# Patient Record
Sex: Female | Born: 1960 | Race: Black or African American | Hispanic: No | Marital: Married | Smoking: Never smoker
Health system: Southern US, Community
[De-identification: ages and names within clinical notes are randomized; demographics above are authoritative.]

## PROBLEM LIST (undated history)

## (undated) DIAGNOSIS — I10 Essential (primary) hypertension: Secondary | ICD-10-CM

## (undated) HISTORY — DX: Essential (primary) hypertension: I10

---

## 2006-03-10 HISTORY — PX: CHOLECYSTECTOMY: SHX55

## 2009-07-08 DEATH — deceased

## 2016-06-13 ENCOUNTER — Ambulatory Visit: Payer: Self-pay | Admitting: Family Medicine

## 2016-06-19 NOTE — Progress Notes (Signed)
Pre visit review using our clinic review tool, if applicable. No additional management support is needed unless otherwise documented below in the visit note. 

## 2016-06-19 NOTE — Patient Instructions (Addendum)
  A few things to remember from today's visit:   URI, acute - Plan: benzonatate (TESSALON) 100 MG capsule  Hypertension, essential, benign - Plan: Basic metabolic panel, amLODipine-benazepril (LOTREL) 5-10 MG capsule  Morbid obesity with body mass index (BMI) of 45.0 to 49.9 in adult (HCC)  Blood pressure goal for most people is less than 140/90. Some populations (older than 60) the goal is less than 150/90.  Most recent cardiologists' recommendations recommend blood pressure at or less than 130/80.   Elevated blood pressure increases the risk of strokes, heart and kidney disease, and eye problems. Regular physical activity and a healthy diet (DASH diet) usually help. Low salt diet. Take medications as instructed.  Caution with some over the counter medications as cold medications, dietary products (for weight loss), and Ibuprofen or Aleve (frequent use);all these medications could cause elevation of blood pressure.  Cold:  viral infections are self-limited and we treat each symptom depending of severity.  Over the counter medications as decongestants and cold medications usually help, they need to be taken with caution if there is a history of high blood pressure or palpitations.SO NO RECOMMENDED FOR YOU>  Tylenol also helps with most symptoms (headache, muscle aching, fever,etc) Plenty of fluids. Honey helps with cough. Steam inhalations helps with runny nose, nasal congestion, and may prevent sinus infections. Cough and nasal congestion could last a few days and sometimes weeks. Please follow in not any better in 1-2 weeks or if symptoms get worse.   Please be sure medication list is accurate. If a new problem present, please set up appointment sooner than planned today.          WE NOW OFFER   [facility] FAST TRACK!!!  SAME DAY Appointments for ACUTE CARE  Such as: Sprains, Injuries, cuts, abrasions, rashes, muscle pain, joint pain, back pain Colds,  flu, sore throats, headache, allergies, cough, fever  Ear pain, sinus and eye infections Abdominal pain, nausea, vomiting, diarrhea, upset stomach Animal/insect bites  3 Easy Ways to Schedule: Walk-In Scheduling Call in scheduling Mychart Sign-up: [email or web address]

## 2016-06-20 ENCOUNTER — Encounter: Payer: Self-pay | Admitting: Family Medicine

## 2016-06-20 ENCOUNTER — Ambulatory Visit (INDEPENDENT_AMBULATORY_CARE_PROVIDER_SITE_OTHER): Payer: BLUE CROSS/BLUE SHIELD | Admitting: Family Medicine

## 2016-06-20 VITALS — BP 165/90 | HR 86 | Resp 12 | Ht 62.0 in | Wt 252.1 lb

## 2016-06-20 DIAGNOSIS — I10 Essential (primary) hypertension: Secondary | ICD-10-CM | POA: Insufficient documentation

## 2016-06-20 DIAGNOSIS — J069 Acute upper respiratory infection, unspecified: Secondary | ICD-10-CM

## 2016-06-20 DIAGNOSIS — Z6841 Body Mass Index (BMI) 40.0 and over, adult: Secondary | ICD-10-CM | POA: Diagnosis not present

## 2016-06-20 LAB — BASIC METABOLIC PANEL
BUN: 9 mg/dL (ref 7–25)
CO2: 23 mmol/L (ref 20–31)
Calcium: 9.2 mg/dL (ref 8.6–10.4)
Chloride: 102 mmol/L (ref 98–110)
Creat: 1 mg/dL (ref 0.50–1.05)
GLUCOSE: 70 mg/dL (ref 65–99)
POTASSIUM: 4.5 mmol/L (ref 3.5–5.3)
SODIUM: 141 mmol/L (ref 135–146)

## 2016-06-20 LAB — POC INFLUENZA A&B (BINAX/QUICKVUE)
Influenza A, POC: NEGATIVE
Influenza B, POC: NEGATIVE

## 2016-06-20 MED ORDER — BENZONATATE 100 MG PO CAPS: 200.0000 mg | ORAL_CAPSULE | Freq: Two times a day (BID) | ORAL | 0 refills | Status: AC | PRN

## 2016-06-20 MED ORDER — AMLODIPINE BESY-BENAZEPRIL HCL 5-10 MG PO CAPS
1.0000 | ORAL_CAPSULE | Freq: Every day | ORAL | 2 refills | Status: DC
Start: 2016-06-20 — End: 2017-03-16

## 2016-06-20 NOTE — Progress Notes (Signed)
HPI:   [name] is a 56 y.o. female, who is here today to establish care with me.  Former PCP: Dr Courtney Scott, [place]. She moved from [place] in August 2017. Last preventive routine visit: 2017 She has not had colonoscopy and she is not interested in doing so for now.  Chronic medical problems: HTN. She denies history of diabetes,CKD, or hyperlipidemia.   Concerns today:   Hypertension:   She has not been on antihypertensive med for about 3 months, she is not sure about medication she was taking, she thinks it was a combination of lisinopril-HCTZ. According to patient, her BP was not well-controlled even when she was taking medication. She has not been consistent with low salt diet.  She does not check BP at home.  She has not noted visual changes, exertional chest pain, dyspnea,  focal weakness, or edema. Frontal pressure headache, "regular normal headache."  -She does not exercise regularly, she has not been consistent with a healthy diet.  Respiratory symptoms:  Today she is complaining of 2 days of fever, 101 F.  Also sore throat, nasal congestion, rhinorrhea, nonproductive cough, body aches, and fatigue.   Denies chest pain, dyspnea, or wheezing.  No Hx of recent travel. No sick contact. No known insect bite. No Hx of allergies.  OTC medications for this problem: Took Tylenol cold and sinus,none today. Symptoms otherwise stable.    Review of Systems  Constitutional: Positive for appetite change, fatigue and fever. Negative for chills.  HENT: Positive for congestion, postnasal drip, rhinorrhea and sore throat. Negative for ear pain, mouth sores, sinus pressure, sneezing, trouble swallowing and voice change.   Eyes: Positive for discharge. Negative for redness and visual disturbance.  Respiratory: Positive for cough. Negative for shortness of breath and wheezing.   Cardiovascular: Negative for chest pain, palpitations and leg swelling.    Gastrointestinal: Negative for abdominal pain, nausea and vomiting.       No changes in bowel habits.  Endocrine: Negative for cold intolerance and heat intolerance.  Genitourinary: Negative for decreased urine volume and hematuria.  Musculoskeletal: Positive for myalgias. Negative for back pain and neck pain.  Skin: Negative for pallor and rash.  Neurological: Positive for headaches. Negative for syncope and weakness.  Hematological: Negative for adenopathy. Does not bruise/bleed easily.  Psychiatric/Behavioral: Negative for confusion. The patient is not nervous/anxious.      No current outpatient prescriptions on file prior to visit.   No current facility-administered medications on file prior to visit.     Past Medical History:  Diagnosis Date  . Hypertension    Not on File  Family History  Problem Relation Age of Onset  . Cancer Mother     breast  . Hypertension Mother   . Diabetes Mother   . Hypertension Father     Social History   Social History  . Marital status: Unknown    Spouse name: N/A  . Number of children: N/A  . Years of education: N/A   Social History Main Topics  . Smoking status: Never Smoker  . Smokeless tobacco: Never Used  . Alcohol use No  . Drug use: No  . Sexual activity: Not Asked   Other Topics Concern  . None   Social History Narrative  . None    Vitals:   06/20/16 1459 06/20/16 1538  BP: (!) 180/90 (!) 165/90  Pulse: 86   Resp: 12    O2 sat at RA 96% Body  mass index is 46.11 kg/m.  Physical Exam  Nursing note and vitals reviewed. Constitutional: She is oriented to person, place, and time. She appears well-developed. No distress.  HENT:  Head: Atraumatic.  Nose: Rhinorrhea present. Right sinus exhibits no maxillary sinus tenderness and no frontal sinus tenderness. Left sinus exhibits no maxillary sinus tenderness and no frontal sinus tenderness.  Mouth/Throat: Uvula is midline and mucous membranes are normal. Posterior  oropharyngeal erythema (mild) present. No oropharyngeal exudate or posterior oropharyngeal edema.  Eyes: Conjunctivae and EOM are normal. Pupils are equal, round, and reactive to light.  Neck: No tracheal deviation present. Thyromegaly (Mild) present. No thyroid mass present.  Cardiovascular: Normal rate and regular rhythm.   No murmur heard. Pulses:      Dorsalis pedis pulses are 2+ on the right side, and 2+ on the left side.  Respiratory: Effort normal and breath sounds normal. No respiratory distress.  GI: Soft. She exhibits no mass. There is no hepatomegaly. There is no tenderness.  Musculoskeletal: She exhibits no edema.  Lymphadenopathy:    She has cervical adenopathy.       Right cervical: Posterior cervical adenopathy present.       Left cervical: Posterior cervical adenopathy present.  Neurological: She is alert and oriented to person, place, and time. She has normal strength. Coordination and gait normal.  Skin: Skin is warm. No rash noted. No erythema.  Psychiatric: She has a normal mood and affect.  Well groomed, good eye contact.      ASSESSMENT AND PLAN:   Courtney Scott was seen today for establish care.  Diagnoses and all orders for this visit:  URI, acute  Symptoms suggests a viral etiology, symptomatic treatment recommended. Instructed to monitor for signs of complications, instructed about warning signs. I also explained that cough and nasal congestion can last a few days and sometimes weeks.  F/U as needed.   -     benzonatate (TESSALON) 100 MG capsule; Take 2 capsules (200 mg total) by mouth 2 (two) times daily as needed. -     POC Influenza A&B(BINAX/QUICKVUE)  Hypertension, essential, benign  Not well controlled. She should avoid cold medications. Possible complications of elevated BP discussed. Instructed to monitor BP at home. Annual eye examination, reported as current. F.U in 6 weeks.  -     amLODipine-benazepril (LOTREL) 5-10 MG capsule; Take 1  capsule by mouth daily. -     Basic metabolic panel  Morbid obesity with body mass index (BMI) of 45.0 to 49.9 in adult [facility])  We discussed benefits of wt loss as well as adverse effects of obesity. Consistency with healthy diet and physical activity recommended. Daily brisk walking for 15-30 min as tolerated.    Betty G. [place], MD  [facility]. [facility].

## 2017-02-16 ENCOUNTER — Ambulatory Visit: Payer: BLUE CROSS/BLUE SHIELD | Admitting: Family Medicine

## 2017-03-15 NOTE — Progress Notes (Signed)
[name] is a 57 y.o.female, who is here today to follow on HTN.  Currently she is on Lotrel 5-10 mg daily.  She is taking medications as instructed, no side effects reported. BP readings at home: "good" 130's/70-80's.  Last eye exam: 2015.  She has not noted unusual headache, visual changes, exertional chest pain, dyspnea,  focal weakness, or edema.    Lab Results  Component Value Date   CREATININE 1.00 06/20/2016   BUN 9 06/20/2016   NA 141 06/20/2016   K 4.5 06/20/2016   CL 102 06/20/2016   CO2 23 06/20/2016    Obesity:  Dietary changes since last OV: Not consistently Exercise: None  She is concerned about possibility of developing diabetes, she has positive family history. Denies abdominal pain, nausea,vomiting, polydipsia,polyuria, or polyphagia.     Review of Systems  Constitutional: Negative for activity change, appetite change, fatigue, fever and unexpected weight change.  HENT: Negative for mouth sores, nosebleeds and trouble swallowing.   Eyes: Negative for redness and visual disturbance.  Respiratory: Negative for cough, shortness of breath and wheezing.   Cardiovascular: Negative for chest pain, palpitations and leg swelling.  Gastrointestinal: Negative for abdominal pain, nausea and vomiting.       Negative for changes in bowel habits.  Endocrine: Negative for polydipsia, polyphagia and polyuria.  Genitourinary: Negative for decreased urine volume, dysuria and hematuria.  Musculoskeletal: Negative for gait problem and myalgias.  Neurological: Negative for syncope, weakness, numbness and headaches.  Psychiatric/Behavioral: Negative for confusion. The patient is not nervous/anxious.      No current outpatient medications on file prior to visit.   No current facility-administered medications on file prior to visit.      Past Medical History:  Diagnosis Date  . Hypertension     Not on File  Social History   Socioeconomic  History  . Marital status: Married    Spouse name: None  . Number of children: None  . Years of education: None  . Highest education level: None  Social Needs  . Financial resource strain: None  . Food insecurity - worry: None  . Food insecurity - inability: None  . Transportation needs - medical: None  . Transportation needs - non-medical: None  Occupational History  . None  Tobacco Use  . Smoking status: Never Smoker  . Smokeless tobacco: Never Used  Substance and Sexual Activity  . Alcohol use: No  . Drug use: No  . Sexual activity: None  Other Topics Concern  . None  Social History Narrative  . None    Vitals:   03/16/17 0828  BP: 132/76  Pulse: 60  Temp: 98 F (36.7 C)  SpO2: 99%   Body mass index is 47.42 kg/m.  Wt Readings from Last 3 Encounters:  03/16/17 259 lb 4 oz (117.6 kg)  06/20/16 252 lb 2 oz (114.4 kg)    Physical Exam  Nursing note and vitals reviewed. Constitutional: She is oriented to person, place, and time. She appears well-developed. No distress.  HENT:  Head: Normocephalic and atraumatic.  Mouth/Throat: Oropharynx is clear and moist and mucous membranes are normal.  Eyes: Conjunctivae are normal. Pupils are equal, round, and reactive to light.  Neck: No tracheal deviation present. No thyroid mass present.  Cardiovascular: Normal rate and regular rhythm.  No murmur heard. Pulses:      Dorsalis pedis pulses are 2+ on the right side, and 2+ on the left side.  Respiratory: Effort normal  and breath sounds normal. No respiratory distress.  GI: Soft. She exhibits no mass. There is no hepatomegaly. There is no tenderness.  Musculoskeletal: She exhibits no edema.  Lymphadenopathy:    She has no cervical adenopathy.  Neurological: She is alert and oriented to person, place, and time. She has normal strength. Coordination normal.  Skin: Skin is warm. No erythema.  Psychiatric: She has a normal mood and affect.  Well groomed, good eye contact.     ASSESSMENT AND PLAN:   Courtney Scott was seen today for follow-up.  Diagnoses and all orders for this visit:  Lab Results  Component Value Date   ALT 17 03/16/2017   AST 17 03/16/2017   ALKPHOS 96 03/16/2017   BILITOT 0.4 03/16/2017   Lab Results  Component Value Date   CREATININE 0.71 03/16/2017   BUN 9 03/16/2017   NA 140 03/16/2017   K 3.8 03/16/2017   CL 103 03/16/2017   CO2 28 03/16/2017    Hypertension, essential, benign  Adequately controlled. No changes in current management. DASH diet recommended. Eye exam recommended annually. F/U in 6 months, before if needed.  -     Comprehensive metabolic panel -     amLODipine-benazepril (LOTREL) 5-10 MG capsule; Take 1 capsule by mouth daily.  Morbid obesity with body mass index (BMI) of 45.0 to 49.9 in adult [facility](HCC)  She has gained about 7 pounds since 06/2016.   We discussed benefits of wt loss as well as adverse effects of obesity. Consistency with healthy diet and physical activity recommended. Daily brisk walking for 15-30 min as tolerated.  Diabetes mellitus screening  Primary prevention through a healthy lifestyle recommended and discussed. Further recommendations will be given according to lab results.  -     Hemoglobin A1c -     Comprehensive metabolic panel      -Courtney Scott was advised to return sooner than planned today if new concerns arise.     Hridaan Bouse G. [place], MD  [facility]. [facility].

## 2017-03-16 ENCOUNTER — Encounter: Payer: Self-pay | Admitting: Family Medicine

## 2017-03-16 ENCOUNTER — Ambulatory Visit: Payer: BLUE CROSS/BLUE SHIELD | Admitting: Family Medicine

## 2017-03-16 VITALS — BP 132/76 | HR 60 | Temp 98.0°F | Ht 62.0 in | Wt 259.2 lb

## 2017-03-16 DIAGNOSIS — Z6841 Body Mass Index (BMI) 40.0 and over, adult: Secondary | ICD-10-CM

## 2017-03-16 DIAGNOSIS — I10 Essential (primary) hypertension: Secondary | ICD-10-CM

## 2017-03-16 DIAGNOSIS — Z131 Encounter for screening for diabetes mellitus: Secondary | ICD-10-CM | POA: Diagnosis not present

## 2017-03-16 LAB — COMPREHENSIVE METABOLIC PANEL
ALK PHOS: 96 U/L (ref 39–117)
ALT: 17 U/L (ref 0–35)
AST: 17 U/L (ref 0–37)
Albumin: 4.1 g/dL (ref 3.5–5.2)
BILIRUBIN TOTAL: 0.4 mg/dL (ref 0.2–1.2)
BUN: 9 mg/dL (ref 6–23)
CO2: 28 meq/L (ref 19–32)
Calcium: 9.1 mg/dL (ref 8.4–10.5)
Chloride: 103 mEq/L (ref 96–112)
Creatinine, Ser: 0.71 mg/dL (ref 0.40–1.20)
GFR: 90.38 mL/min (ref >60.00)
Glucose, Bld: 103 mg/dL — ABNORMAL HIGH (ref 70–99)
Potassium: 3.8 mEq/L (ref 3.5–5.1)
SODIUM: 140 meq/L (ref 135–145)
TOTAL PROTEIN: 7.1 g/dL (ref 6.0–8.3)

## 2017-03-16 LAB — HEMOGLOBIN A1C: HEMOGLOBIN A1C: 5.8 % (ref 4.6–6.5)

## 2017-03-16 MED ORDER — AMLODIPINE BESY-BENAZEPRIL HCL 5-10 MG PO CAPS: 1.0000 | ORAL_CAPSULE | Freq: Every day | ORAL | 1 refills | Status: DC

## 2017-03-16 NOTE — Patient Instructions (Signed)
A few things to remember from today's visit:   Hypertension, essential, benign - Plan: Comprehensive metabolic panel  Morbid obesity with body mass index (BMI) of 45.0 to 49.9 in adult [facility](HCC)  Diabetes mellitus screening - Plan: Hemoglobin A1c, Comprehensive metabolic panel   DASH Eating Plan DASH stands for "Dietary Approaches to Stop Hypertension." The DASH eating plan is a healthy eating plan that has been shown to reduce high blood pressure (hypertension). It may also reduce your risk for type 2 diabetes, heart disease, and stroke. The DASH eating plan may also help with weight loss. What are tips for following this plan? General guidelines  Avoid eating more than 2,300 mg (milligrams) of salt (sodium) a day. If you have hypertension, you may need to reduce your sodium intake to 1,500 mg a day.  Limit alcohol intake to no more than 1 drink a day for nonpregnant women and 2 drinks a day for men. One drink equals 12 oz of beer, 5 oz of wine, or 1 oz of hard liquor.  Work with your health care provider to maintain a healthy body weight or to lose weight. Ask what an ideal weight is for you.  Get at least 30 minutes of exercise that causes your heart to beat faster (aerobic exercise) most days of the week. Activities may include walking, swimming, or biking.  Work with your health care provider or diet and nutrition specialist (dietitian) to adjust your eating plan to your individual calorie needs. Reading food labels  Check food labels for the amount of sodium per serving. Choose foods with less than 5 percent of the Daily Value of sodium. Generally, foods with less than 300 mg of sodium per serving fit into this eating plan.  To find whole grains, look for the word "whole" as the first word in the ingredient list. Shopping  Buy products labeled as "low-sodium" or "no salt added."  Buy fresh foods. Avoid canned foods and premade or frozen meals. Cooking  Avoid adding salt when  cooking. Use salt-free seasonings or herbs instead of table salt or sea salt. Check with your health care provider or pharmacist before using salt substitutes.  Do not fry foods. Cook foods using healthy methods such as baking, boiling, grilling, and broiling instead.  Cook with heart-healthy oils, such as olive, canola, soybean, or sunflower oil. Meal planning   Eat a balanced diet that includes: ? 5 or more servings of fruits and vegetables each day. At each meal, try to fill half of your plate with fruits and vegetables. ? Up to 6-8 servings of whole grains each day. ? Less than 6 oz of lean meat, poultry, or fish each day. A 3-oz serving of meat is about the same size as a deck of cards. One egg equals 1 oz. ? 2 servings of low-fat dairy each day. ? A serving of nuts, seeds, or beans 5 times each week. ? Heart-healthy fats. Healthy fats called Omega-3 fatty acids are found in foods such as flaxseeds and coldwater fish, like sardines, salmon, and mackerel.  Limit how much you eat of the following: ? Canned or prepackaged foods. ? Food that is high in trans fat, such as fried foods. ? Food that is high in saturated fat, such as fatty meat. ? Sweets, desserts, sugary drinks, and other foods with added sugar. ? Full-fat dairy products.  Do not salt foods before eating.  Try to eat at least 2 vegetarian meals each week.  Eat more home-cooked food  and less restaurant, buffet, and fast food.  When eating at a restaurant, ask that your food be prepared with less salt or no salt, if possible. What foods are recommended? The items listed may not be a complete list. Talk with your dietitian about what dietary choices are best for you. Grains Whole-grain or whole-wheat bread. Whole-grain or whole-wheat pasta. Brown rice. [place]. Bulgur. Whole-grain and low-sodium cereals. Pita bread. Low-fat, low-sodium crackers. Whole-wheat flour tortillas. Vegetables Fresh or frozen vegetables  (raw, steamed, roasted, or grilled). Low-sodium or reduced-sodium tomato and vegetable juice. Low-sodium or reduced-sodium tomato sauce and tomato paste. Low-sodium or reduced-sodium canned vegetables. Fruits All fresh, dried, or frozen fruit. Canned fruit in natural juice (without added sugar). Meat and other protein foods Skinless chicken or [place]. Ground chicken or [place]. Pork with fat trimmed off. Fish and seafood. Egg whites. Dried beans, peas, or lentils. Unsalted nuts, nut butters, and seeds. Unsalted canned beans. Lean cuts of beef with fat trimmed off. Low-sodium, lean deli meat. Dairy Low-fat (1%) or fat-free (skim) milk. Fat-free, low-fat, or reduced-fat cheeses. Nonfat, low-sodium ricotta or cottage cheese. Low-fat or nonfat yogurt. Low-fat, low-sodium cheese. Fats and oils Soft margarine without trans fats. Vegetable oil. Low-fat, reduced-fat, or light mayonnaise and salad dressings (reduced-sodium). Canola, safflower, olive, soybean, and sunflower oils. Avocado. Seasoning and other foods Herbs. Spices. Seasoning mixes without salt. Unsalted popcorn and pretzels. Fat-free sweets. What foods are not recommended? The items listed may not be a complete list. Talk with your dietitian about what dietary choices are best for you. Grains Baked goods made with fat, such as croissants, muffins, or some breads. Dry pasta or rice meal packs. Vegetables Creamed or fried vegetables. Vegetables in a cheese sauce. Regular canned vegetables (not low-sodium or reduced-sodium). Regular canned tomato sauce and paste (not low-sodium or reduced-sodium). Regular tomato and vegetable juice (not low-sodium or reduced-sodium). Angie Fava. Olives. Fruits Canned fruit in a light or heavy syrup. Fried fruit. Fruit in cream or butter sauce. Meat and other protein foods Fatty cuts of meat. Ribs. Fried meat. Berniece Salines. Sausage. Bologna and other processed lunch meats. Salami. Fatback. Hotdogs. Bratwurst. Salted nuts  and seeds. Canned beans with added salt. Canned or smoked fish. Whole eggs or egg yolks. Chicken or [place] with skin. Dairy Whole or 2% milk, cream, and half-and-half. Whole or full-fat cream cheese. Whole-fat or sweetened yogurt. Full-fat cheese. Nondairy creamers. Whipped toppings. Processed cheese and cheese spreads. Fats and oils Butter. Stick margarine. Lard. Shortening. Ghee. Bacon fat. Tropical oils, such as coconut, palm kernel, or palm oil. Seasoning and other foods Salted popcorn and pretzels. Onion salt, garlic salt, seasoned salt, table salt, and sea salt. Worcestershire sauce. Tartar sauce. Barbecue sauce. Teriyaki sauce. Soy sauce, including reduced-sodium. Steak sauce. Canned and packaged gravies. Fish sauce. Oyster sauce. Cocktail sauce. Horseradish that you find on the shelf. Ketchup. Mustard. Meat flavorings and tenderizers. Bouillon cubes. Hot sauce and Tabasco sauce. Premade or packaged marinades. Premade or packaged taco seasonings. Relishes. Regular salad dressings. Where to find more information:  National Heart, Lung, and [place]: https://wilson-eaton.com/  American Heart Association: www.heart.org Summary  The DASH eating plan is a healthy eating plan that has been shown to reduce high blood pressure (hypertension). It may also reduce your risk for type 2 diabetes, heart disease, and stroke.  With the DASH eating plan, you should limit salt (sodium) intake to 2,300 mg a day. If you have hypertension, you may need to reduce your sodium intake to 1,500 mg a  day.  When on the DASH eating plan, aim to eat more fresh fruits and vegetables, whole grains, lean proteins, low-fat dairy, and heart-healthy fats.  Work with your health care provider or diet and nutrition specialist (dietitian) to adjust your eating plan to your individual calorie needs. This information is not intended to replace advice given to you by your health care provider. Make sure you discuss any questions  you have with your health care provider. Document Released: 02/13/2011 Document Revised: 02/18/2016 Document Reviewed: 02/18/2016 Elsevier Interactive Patient Education  [employer].   Please be sure medication list is accurate. If a new problem present, please set up appointment sooner than planned today.

## 2017-06-29 ENCOUNTER — Ambulatory Visit: Payer: Self-pay | Admitting: Family Medicine

## 2017-06-29 DIAGNOSIS — Z2089 Contact with and (suspected) exposure to other communicable diseases: Secondary | ICD-10-CM

## 2017-11-29 NOTE — Progress Notes (Signed)
HPI:   [name] is a 57 y.o. female, who is here today for her routine physical.  Last CPE: 2017  Regular exercise 3 or more time per week: She just started water aerobic last week,she is planning on doing it 2/week. Following a healthy diet: She is "trying." She lives with her husband.  Chronic medical problems: HTN, obesity  HTN on Amlodipine-Benazepril 5-10 mg daily. BP elevated today. Denies severe/frequent headache, visual changes, chest pain, dyspnea, palpitation, claudication, focal weakness, or edema. She is not checking BP's at home.   Pap smear 2017 negative. Hx of abnormal pap smears: Denies. Hx of STD's Denies. M: 57 yo.  G:4 A:2 L:2 LMP 2008 after endometrial ablation.  She took OCP's for  A while before ablation.   She is not sexually active.  There is no immunization history on file for this patient.  Mammogram: 2017 Colonoscopy: Has not had colomn cancer screening done. DEXA: N/A  Hep C screening: Never done.  She has no concerns today.   Review of Systems  Constitutional: Negative for appetite change, fatigue, fever and unexpected weight change.  HENT: Negative for dental problem, hearing loss, nosebleeds, trouble swallowing and voice change.   Eyes: Negative for redness and visual disturbance.  Respiratory: Negative for cough, shortness of breath and wheezing.   Cardiovascular: Negative for chest pain and leg swelling.  Gastrointestinal: Negative for abdominal pain, blood in stool, nausea and vomiting.       No changes in bowel habits.  Endocrine: Negative for cold intolerance, heat intolerance, polydipsia, polyphagia and polyuria.  Genitourinary: Negative for decreased urine volume, dysuria, hematuria, vaginal bleeding and vaginal discharge.       No breast tenderness or nipple discharge.  Musculoskeletal: Negative for gait problem and myalgias.  Skin: Negative for rash.  Neurological: Negative for syncope,  weakness and headaches.  Hematological: Negative for adenopathy. Does not bruise/bleed easily.  Psychiatric/Behavioral: Negative for confusion and sleep disturbance. The patient is not nervous/anxious.   All other systems reviewed and are negative.     No current outpatient medications on file prior to visit.   No current facility-administered medications on file prior to visit.      Past Medical History:  Diagnosis Date  . Hypertension     Past Surgical History:  Procedure Laterality Date  . CHOLECYSTECTOMY  2008    Not on File  Family History  Problem Relation Age of Onset  . Cancer Mother        breast  . Hypertension Mother   . Diabetes Mother   . Hypertension Father     Social History   Socioeconomic History  . Marital status: Married    Spouse name: Not on file  . Number of children: Not on file  . Years of education: Not on file  . Highest education level: Not on file  Occupational History  . Not on file  Social Needs  . Financial resource strain: Not on file  . Food insecurity:    Worry: Not on file    Inability: Not on file  . Transportation needs:    Medical: Not on file    Non-medical: Not on file  Tobacco Use  . Smoking status: Never Smoker  . Smokeless tobacco: Never Used  Substance and Sexual Activity  . Alcohol use: No  . Drug use: No  . Sexual activity: Not on file  Lifestyle  . Physical activity:    Days per week: Not  on file    Minutes per session: Not on file  . Stress: Not on file  Relationships  . Social connections:    Talks on phone: Not on file    Gets together: Not on file    Attends religious service: Not on file    Active member of club or organization: Not on file    Attends meetings of clubs or organizations: Not on file    Relationship status: Not on file  Other Topics Concern  . Not on file  Social History Narrative  . Not on file     Vitals:   11/30/17 0817  BP: (!) 150/90  Pulse: 62  Resp: 16  Temp:  98 F (36.7 C)  SpO2: 96%   Body mass index is 48.1 kg/m.   Wt Readings from Last 3 Encounters:  11/30/17 263 lb (119.3 kg)  03/16/17 259 lb 4 oz (117.6 kg)  06/20/16 252 lb 2 oz (114.4 kg)    Physical Exam  Nursing note and vitals reviewed. Constitutional: She is oriented to person, place, and time. She appears well-developed. No distress.  HENT:  Head: Normocephalic and atraumatic.  Right Ear: Hearing, tympanic membrane, external ear and ear canal normal.  Left Ear: Hearing, tympanic membrane, external ear and ear canal normal.  Mouth/Throat: Uvula is midline, oropharynx is clear and moist and mucous membranes are normal.  Eyes: Pupils are equal, round, and reactive to light. Conjunctivae and EOM are normal.  Neck: No tracheal deviation present. No thyromegaly present.  Cardiovascular: Normal rate and regular rhythm.  No murmur heard. Pulses:      Dorsalis pedis pulses are 2+ on the right side, and 2+ on the left side.  Respiratory: Effort normal and breath sounds normal. No respiratory distress.  GI: Soft. She exhibits no mass. There is no hepatomegaly. There is no tenderness.  Genitourinary: There is no rash, tenderness or lesion on the right labia. There is no rash, tenderness or lesion on the left labia. Uterus is not enlarged and not tender. Cervix exhibits no motion tenderness, no discharge and no friability. Right adnexum displays no mass, no tenderness and no fullness. Left adnexum displays no mass, no tenderness and no fullness. No erythema or bleeding in the vagina. No vaginal discharge found.  Genitourinary Comments: Breast: No masses, skin abnormalities, or nipple discharge appreciated bilateral. Pap smear collected.   Musculoskeletal: She exhibits no edema.  No major deformity or signs of synovitis appreciated.  Lymphadenopathy:    She has no cervical adenopathy.       Right: No inguinal and no supraclavicular adenopathy present.       Left: No inguinal and no  supraclavicular adenopathy present.  Neurological: She is alert and oriented to person, place, and time. She has normal strength. No cranial nerve deficit. Coordination and gait normal.  Reflex Scores:      Bicep reflexes are 2+ on the right side and 2+ on the left side.      Patellar reflexes are 2+ on the right side and 2+ on the left side. Skin: Skin is warm. No rash noted. No erythema.  Psychiatric: She has a normal mood and affect. Her speech is normal.  Well groomed, good eye contact.      ASSESSMENT AND PLAN:  [name] was here today annual physical examination.   Orders Placed This Encounter  Procedures  . Mammogram Digital Screening  . Basic metabolic panel  . Lipid panel  . TSH  .  Hepatitis C antibody  . HIV Antibody (routine testing w rflx)   Lab Results  Component Value Date   CHOL 201 (H) 11/30/2017   HDL 63.00 11/30/2017   LDLCALC 121 (H) 11/30/2017   TRIG 84.0 11/30/2017   CHOLHDL 3 11/30/2017   Lab Results  Component Value Date   CREATININE 0.76 11/30/2017   BUN 10 11/30/2017   NA 140 11/30/2017   K 4.0 11/30/2017   CL 103 11/30/2017   CO2 29 11/30/2017   Lab Results  Component Value Date   TSH 1.34 11/30/2017    Routine general medical examination at a health care facility We discussed the importance of regular physical activity and healthy diet for prevention of chronic illness and/or complications. Preventive guidelines reviewed. Vaccination up to date.  Ca++ and vit D supplementation recommended, Ca++ 1000-1200 mg through her diet ideally. Next CPE in a year.  The 10-year ASCVD risk score Denman George DC Montez Hageman., et al., 2013) is: 8.4%   Values used to calculate the score:     Age: 63 years     Sex: Female     Is Non-Hispanic African American: Yes     Diabetic: No     Tobacco smoker: No     Systolic Blood Pressure: 150 mmHg     Is BP treated: Yes     HDL Cholesterol: 63 mg/dL     Total Cholesterol: 201 mg/dL  Diabetes  mellitus screening -     Basic metabolic panel  Screening for lipid disorders -     Lipid panel  Cervical cancer screening -     Cytology - PAP ([place])  Breast cancer screening -     Mammogram Digital Screening; Future  Colon cancer screening After discussion of colon cancer screening options,she prefers to do annual FIT.  Encounter for HCV screening test for high risk patient -     Hepatitis C antibody  Encounter for screening for HIV -     HIV Antibody (routine testing w rflx)   Hypertension, essential, benign Not well controlled. Possible complications of elevated BP discussed. Benicar increased from 10 mg to 20 mg, no changes in Amlodipine 5 mg, Lotrel 5-20 mg was sent to her pharmacy. Recommend monitoring BP at home. Continue low-salt diet. She is overdue for eye exam. Follow-up in 2 to 3 months.     Return in 3 months (on 03/01/2018) for HTN.     Talma Aguillard G. [place], MD  [facility]. [place] office.

## 2017-11-30 ENCOUNTER — Encounter: Payer: Self-pay | Admitting: Family Medicine

## 2017-11-30 ENCOUNTER — Other Ambulatory Visit (HOSPITAL_COMMUNITY)
Admission: RE | Admit: 2017-11-30 | Discharge: 2017-11-30 | Disposition: A | Discharge status: Home / Self Care | Payer: BLUE CROSS/BLUE SHIELD | Source: Ambulatory Visit | Attending: Family Medicine | Admitting: Family Medicine

## 2017-11-30 ENCOUNTER — Ambulatory Visit (INDEPENDENT_AMBULATORY_CARE_PROVIDER_SITE_OTHER): Payer: BLUE CROSS/BLUE SHIELD | Admitting: Family Medicine

## 2017-11-30 VITALS — BP 150/90 | HR 62 | Temp 98.0°F | Resp 16 | Ht 62.0 in | Wt 263.0 lb

## 2017-11-30 DIAGNOSIS — Z13220 Encounter for screening for lipoid disorders: Secondary | ICD-10-CM | POA: Diagnosis not present

## 2017-11-30 DIAGNOSIS — I10 Essential (primary) hypertension: Secondary | ICD-10-CM

## 2017-11-30 DIAGNOSIS — Z0000 Encounter for general adult medical examination without abnormal findings: Secondary | ICD-10-CM

## 2017-11-30 DIAGNOSIS — Z1231 Encounter for screening mammogram for malignant neoplasm of breast: Secondary | ICD-10-CM

## 2017-11-30 DIAGNOSIS — Z124 Encounter for screening for malignant neoplasm of cervix: Secondary | ICD-10-CM | POA: Diagnosis not present

## 2017-11-30 DIAGNOSIS — Z1159 Encounter for screening for other viral diseases: Secondary | ICD-10-CM

## 2017-11-30 DIAGNOSIS — Z131 Encounter for screening for diabetes mellitus: Secondary | ICD-10-CM

## 2017-11-30 DIAGNOSIS — Z1211 Encounter for screening for malignant neoplasm of colon: Secondary | ICD-10-CM

## 2017-11-30 DIAGNOSIS — Z9189 Other specified personal risk factors, not elsewhere classified: Secondary | ICD-10-CM

## 2017-11-30 DIAGNOSIS — Z1239 Encounter for other screening for malignant neoplasm of breast: Secondary | ICD-10-CM

## 2017-11-30 DIAGNOSIS — Z114 Encounter for screening for human immunodeficiency virus [HIV]: Secondary | ICD-10-CM

## 2017-11-30 LAB — LIPID PANEL
CHOLESTEROL: 201 mg/dL — AB (ref 0–200)
HDL: 63 mg/dL (ref >39.00)
LDL Cholesterol: 121 mg/dL — ABNORMAL HIGH (ref 0–99)
NonHDL: 138.04
TRIGLYCERIDES: 84 mg/dL (ref 0.0–149.0)
Total CHOL/HDL Ratio: 3
VLDL: 16.8 mg/dL (ref 0.0–40.0)

## 2017-11-30 LAB — BASIC METABOLIC PANEL
BUN: 10 mg/dL (ref 6–23)
CO2: 29 mEq/L (ref 19–32)
Calcium: 9.3 mg/dL (ref 8.4–10.5)
Chloride: 103 mEq/L (ref 96–112)
Creatinine, Ser: 0.76 mg/dL (ref 0.40–1.20)
GFR: 83.34 mL/min (ref >60.00)
Glucose, Bld: 95 mg/dL (ref 70–99)
Potassium: 4 mEq/L (ref 3.5–5.1)
Sodium: 140 mEq/L (ref 135–145)

## 2017-11-30 LAB — TSH: TSH: 1.34 u[IU]/mL (ref 0.35–4.50)

## 2017-11-30 MED ORDER — AMLODIPINE BESY-BENAZEPRIL HCL 5-20 MG PO CAPS: 1.0000 | ORAL_CAPSULE | Freq: Every day | ORAL | 2 refills | Status: AC

## 2017-11-30 NOTE — Assessment & Plan Note (Signed)
Not well controlled. Possible complications of elevated BP discussed. Benicar increased from 10 mg to 20 mg, no changes in Amlodipine 5 mg, Lotrel 5-20 mg was sent to her pharmacy. Recommend monitoring BP at home. Continue low-salt diet. She is overdue for eye exam. Follow-up in 2 to 3 months.

## 2017-11-30 NOTE — Patient Instructions (Addendum)
A few things to remember from today's visit:   Routine general medical examination at a health care facility  Diabetes mellitus screening - Plan: Basic metabolic panel  Screening for lipid disorders - Plan: Lipid panel  Cervical cancer screening - Plan: Cytology - PAP ()  Breast cancer screening - Plan: Mammogram Digital Screening  Colon cancer screening  Hypertension, essential, benign - Plan: TSH, amLODipine-benazepril (LOTREL) 5-20 MG capsule  Today you have you routine preventive visit.  At least 150 minutes of moderate exercise per week, daily brisk walking for 15-30 min is a good exercise option. Healthy diet low in saturated (animal) fats and sweets and consisting of fresh fruits and vegetables, lean meats such as fish and white chicken and whole grains.  These are some of recommendations for screening depending of age and risk factors:  Today we increased the dose of benazepril from 10 mg to 20 mg, no changes on amlodipine 5 mg.  Please pick up at the pharmacy and use Lotrel 5-20 mg. Monitor blood pressure at home. He denies exam. I will see her back in 2 to 3 months.  - Vaccines:  Tdap vaccine every 10 years.  Shingles vaccine recommended at age 57, could be given after 57 years of age but not sure about insurance coverage.   Pneumonia vaccines:  Prevnar 13 at 65 and Pneumovax at 66. Sometimes Pneumovax is giving earlier if history of smoking, lung disease,diabetes,kidney disease among some.    Screening for diabetes at age 57 and every 3 years.  Cervical cancer prevention:  Pap smear starts at 57 years of age and continues periodically until 57 years old in low risk women. Pap smear every 3 years between 2321 and 57 years old. Pap smear every 3-5 years between women 30 and older if pap smear negative and HPV screening negative.   -Breast cancer: Mammogram: There is disagreement between experts about when to start screening in low risk asymptomatic  female but recent recommendations are to start screening at 1440 and not later than 57 years old , every 1-2 years and after 57 yo q 2 years. Screening is recommended until 57 years old but some women can continue screening depending of healthy issues.   Colon cancer screening: starts at 57 years old until 57 years old.  Cholesterol disorder screening at age 57 and every 3 years.  Also recommended:  1. Dental visit- Brush and floss your teeth twice daily; visit your dentist twice a year. 2. Eye doctor- Get an eye exam at least every 2 years. 3. Helmet use- Always wear a helmet when riding a bicycle, motorcycle, rollerblading or skateboarding. 4. Safe sex- If you may be exposed to sexually transmitted infections, use a condom. 5. Seat belts- Seat belts can save your live; always wear one. 6. Smoke/Carbon Monoxide detectors- These detectors need to be installed on the appropriate level of your home. Replace batteries at least once a year. 7. Skin cancer- When out in the sun please cover up and use sunscreen 15 SPF or higher. 8. Violence- If anyone is threatening or hurting you, please tell your healthcare provider.  9. Drink alcohol in moderation- Limit alcohol intake to one drink or less per day. Never drink and drive.   Please be sure medication list is accurate. If a new problem present, please set up appointment sooner than planned today.

## 2017-12-01 LAB — CYTOLOGY - PAP
Diagnosis: NEGATIVE
HPV (WINDOPATH): NOT DETECTED

## 2017-12-01 LAB — HEPATITIS C ANTIBODY
Hepatitis C Ab: NONREACTIVE
SIGNAL TO CUT-OFF: 0.01 (ref <1.00)

## 2017-12-01 LAB — HIV ANTIBODY (ROUTINE TESTING W REFLEX): HIV: NONREACTIVE

## 2017-12-03 ENCOUNTER — Encounter: Payer: Self-pay | Admitting: Family Medicine

## 2018-05-19 ENCOUNTER — Encounter: Payer: Self-pay | Admitting: *Deleted

## 2018-05-19 ENCOUNTER — Other Ambulatory Visit: Payer: Self-pay

## 2018-05-19 ENCOUNTER — Ambulatory Visit (INDEPENDENT_AMBULATORY_CARE_PROVIDER_SITE_OTHER): Payer: BLUE CROSS/BLUE SHIELD | Admitting: Family Medicine

## 2018-05-19 ENCOUNTER — Encounter: Payer: Self-pay | Admitting: Family Medicine

## 2018-05-19 ENCOUNTER — Ambulatory Visit (INDEPENDENT_AMBULATORY_CARE_PROVIDER_SITE_OTHER): Payer: BLUE CROSS/BLUE SHIELD

## 2018-05-19 VITALS — BP 140/82 | HR 70 | Temp 99.1°F | Resp 12 | Ht 62.0 in | Wt 262.1 lb

## 2018-05-19 DIAGNOSIS — J988 Other specified respiratory disorders: Secondary | ICD-10-CM | POA: Diagnosis not present

## 2018-05-19 DIAGNOSIS — R05 Cough: Secondary | ICD-10-CM

## 2018-05-19 DIAGNOSIS — J989 Respiratory disorder, unspecified: Secondary | ICD-10-CM

## 2018-05-19 DIAGNOSIS — R0989 Other specified symptoms and signs involving the circulatory and respiratory systems: Secondary | ICD-10-CM

## 2018-05-19 DIAGNOSIS — R059 Cough, unspecified: Secondary | ICD-10-CM

## 2018-05-19 MED ORDER — ALBUTEROL SULFATE HFA 108 (90 BASE) MCG/ACT IN AERS: 2.0000 | INHALATION_SPRAY | Freq: Four times a day (QID) | RESPIRATORY_TRACT | 0 refills | Status: AC | PRN

## 2018-05-19 MED ORDER — IPRATROPIUM-ALBUTEROL 0.5-2.5 (3) MG/3ML IN SOLN
3.0000 mL | Freq: Once | RESPIRATORY_TRACT | Status: AC
  Administered 2018-05-19: 3 mL via RESPIRATORY_TRACT

## 2018-05-19 MED ORDER — FLUTICASONE PROPIONATE 50 MCG/ACT NA SUSP: 1.0000 | Freq: Two times a day (BID) | NASAL | 3 refills | Status: AC

## 2018-05-19 MED ORDER — PREDNISONE 20 MG PO TABS: 40.0000 mg | ORAL_TABLET | Freq: Every day | ORAL | 0 refills | Status: AC

## 2018-05-19 MED ORDER — BENZONATATE 100 MG PO CAPS: 200.0000 mg | ORAL_CAPSULE | Freq: Two times a day (BID) | ORAL | 0 refills | Status: AC | PRN

## 2018-05-19 NOTE — Patient Instructions (Addendum)
A few things to remember from today's visit:   Reactive airway disease that is not asthma - Plan: DG Chest 2 View, predniSONE (DELTASONE) 20 MG tablet, albuterol (PROVENTIL HFA;VENTOLIN HFA) 108 (90 Base) MCG/ACT inhaler  Respiratory tract infection  Cough - Plan: benzonatate (TESSALON) 100 MG capsule  Albuterol inh 2 puff every 6 hours for a week then as needed for wheezing or shortness of breath.  Take prednisone with breakfast. Nasal irrigation with saline as needed. Flonase nasal spray to start after you complete prednisone treatment.  Please be sure medication list is accurate. If a new problem present, please set up appointment sooner than planned today.

## 2018-05-19 NOTE — Progress Notes (Signed)
ACUTE VISIT  HPI:  Chief Complaint  Patient presents with  . Cough    cough with mucus that's causing a headache and pain in ribs,  sx started Saturday   . Chest congestion    [name] is a 58 y.o.female here today complaining of 4 days of respiratory symptoms. Productive cough,no hemoptysis. + Wheezing,deneis SOB. Exacerbated by exertion and alleviated by rest.  No Hx of asthma or COPD. She has not noted fever but has had chills and body aches. Cough causes frontal headache and chest wall pain.   Cough  This is a new problem. The current episode started in the past 7 days. The problem has been unchanged. The cough is productive of sputum. Associated symptoms include chills, headaches, myalgias, nasal congestion, postnasal drip, rhinorrhea, a sore throat and wheezing. Pertinent negatives include no ear pain, eye redness, fever, heartburn, hemoptysis, rash, shortness of breath or sweats. The symptoms are aggravated by exercise and lying down. She has tried OTC cough suppressant for the symptoms. The treatment provided mild relief. Her past medical history is significant for environmental allergies. There is no history of asthma or COPD.   No Hx of recent travel. 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 No known insect bite.  OTC medications for this problem: Mucinex and Tylenol.  Review of Systems  Constitutional: Positive for activity change, chills and fatigue. Negative for appetite change and fever.  HENT: Positive for congestion, postnasal drip, rhinorrhea, sinus pressure and sore throat. Negative for ear pain, mouth sores, trouble swallowing and voice change.   Eyes: Negative for discharge and redness.  Respiratory: Positive for cough and wheezing. Negative for hemoptysis, chest tightness and shortness of breath.   Gastrointestinal: Negative for abdominal pain, diarrhea, heartburn, nausea and vomiting.  Musculoskeletal: Positive for myalgias.  Negative for gait problem and neck pain.  Skin: Negative for rash.  Allergic/Immunologic: Positive for environmental allergies.  Neurological: Positive for headaches. Negative for syncope and weakness.  Hematological: Negative for adenopathy. Does not bruise/bleed easily.    Current Outpatient Medications on File Prior to Visit  Medication Sig Dispense Refill  . amLODipine-benazepril (LOTREL) 5-20 MG capsule Take 1 capsule by mouth daily. 30 capsule 2   No current facility-administered medications on file prior to visit.      Past Medical History:  Diagnosis Date  . Hypertension    Not on File  Social History   Socioeconomic History  . Marital status: Married    Spouse name: Not on file  . Number of children: Not on file  . Years of education: Not on file  . Highest education level: Not on file  Occupational History  . Not on file  Social Needs  . Financial resource strain: Not on file  . Food insecurity:    Worry: Not on file    Inability: Not on file  . Transportation needs:    Medical: Not on file    Non-medical: Not on file  Tobacco Use  . Smoking status: Never Smoker  . Smokeless tobacco: Never Used  Substance and Sexual Activity  . Alcohol use: No  . Drug use: No  . Sexual activity: Not on file  Lifestyle  . Physical activity:    Days per week: Not on file    Minutes per session: Not on file  . Stress: Not on file  Relationships  . Social connections:    Talks on phone: Not on file  Gets together: Not on file    Attends religious service: Not on file    Active member of club or organization: Not on file    Attends meetings of clubs or organizations: Not on file    Relationship status: Not on file  Other Topics Concern  . Not on file  Social History Narrative  . Not on file    Vitals:   05/19/18 0925  BP: 140/82  Pulse: 70  Resp: 12  Temp: 99.1 F (37.3 C)  SpO2: 96%   Body mass index is 47.94 kg/m.  Physical Exam  Nursing note and  vitals reviewed. Constitutional: She is oriented to person, place, and time. She appears well-developed. She does not appear ill. No distress.  HENT:  Head: Normocephalic and atraumatic.  Nose: Rhinorrhea present. Right sinus exhibits no maxillary sinus tenderness and no frontal sinus tenderness. Left sinus exhibits no maxillary sinus tenderness and no frontal sinus tenderness.  Mouth/Throat: Oropharynx is clear and moist and mucous membranes are normal.  Hypertrophic turbinates. Postnasal drainage.  Eyes: Conjunctivae are normal.  Cardiovascular: Normal rate and regular rhythm.  No murmur heard. Respiratory: Effort normal. No respiratory distress. She has wheezes.  Lymphadenopathy:    She has cervical adenopathy.       Right cervical: Posterior cervical adenopathy present.       Left cervical: Posterior cervical adenopathy present.  Neurological: She is alert and oriented to person, place, and time. She has normal strength.  Skin: Skin is warm. No rash noted. No erythema.  Psychiatric: She has a normal mood and affect.  Well groomed, good eye contact.    ASSESSMENT AND PLAN:  [name] was seen today for cough and chest congestion.  Diagnoses and all orders for this visit:  Reactive airway disease that is not asthma After Duoneb neb treatment  Still diffused wheezing,improved. No rales or rhonchi. Albuterol inh 2 puff every 6 hours for a week then as needed for wheezing or shortness of breath.  Prednisone side effects discussed. Instructed about warning signs.  -     DG Chest 2 View; Future -     predniSONE (DELTASONE) 20 MG tablet; Take 2 tablets (40 mg total) by mouth daily with breakfast for 3 days. -     albuterol (PROVENTIL HFA;VENTOLIN HFA) 108 (90 Base) MCG/ACT inhaler; Inhale 2 puffs into the lungs every 6 (six) hours as needed for wheezing or shortness of breath. -     ipratropium-albuterol (DUONEB) 0.5-2.5 (3) MG/3ML nebulizer solution 3 mL  Respiratory tract  infection Most likely viral. Explained that abx is not needed at this time. Further recommendations will be given according to imaging results.  -     ipratropium-albuterol (DUONEB) 0.5-2.5 (3) MG/3ML nebulizer solution 3 mL  Cough -     benzonatate (TESSALON) 100 MG capsule; Take 2 capsules (200 mg total) by mouth 2 (two) times daily as needed for up to 10 days. -     ipratropium-albuterol (DUONEB) 0.5-2.5 (3) MG/3ML nebulizer solution 3 mL  Other orders -     fluticasone (FLONASE) 50 MCG/ACT nasal spray; Place 1 spray into both nostrils 2 (two) times daily.   Return if symptoms worsen or fail to improve.    Betty G. [place], MD  [facility]. [place] office.

## 2018-06-21 ENCOUNTER — Telehealth: Payer: Self-pay | Admitting: *Deleted

## 2018-06-21 NOTE — Telephone Encounter (Signed)
Clinic RN attempted to call. No answer. Left a message for patient to return call. CRM created.

## 2018-06-21 NOTE — Telephone Encounter (Signed)
Copied from CRM [id number]. Topic: Appointment Scheduling - Scheduling Inquiry for Clinic >> Jun 18, 2018  1:59 PM Baldo Daub L wrote: Reason for CRM:   Pt calling because she would like OV for kidney pain.

## 2018-06-22 NOTE — Telephone Encounter (Signed)
Spoke with patient and offered virtual visit, patient declined, stated that she would rather wait and call to schedule at a later time.

## 2019-12-25 IMAGING — DX CHEST - 2 VIEW
2 series · 2 of 2 positions shown · non-contrast
Comparison: None.

CLINICAL DATA: Cough and congestion for 5 days.

EXAM:
CHEST - 2 VIEW

[chest pa]
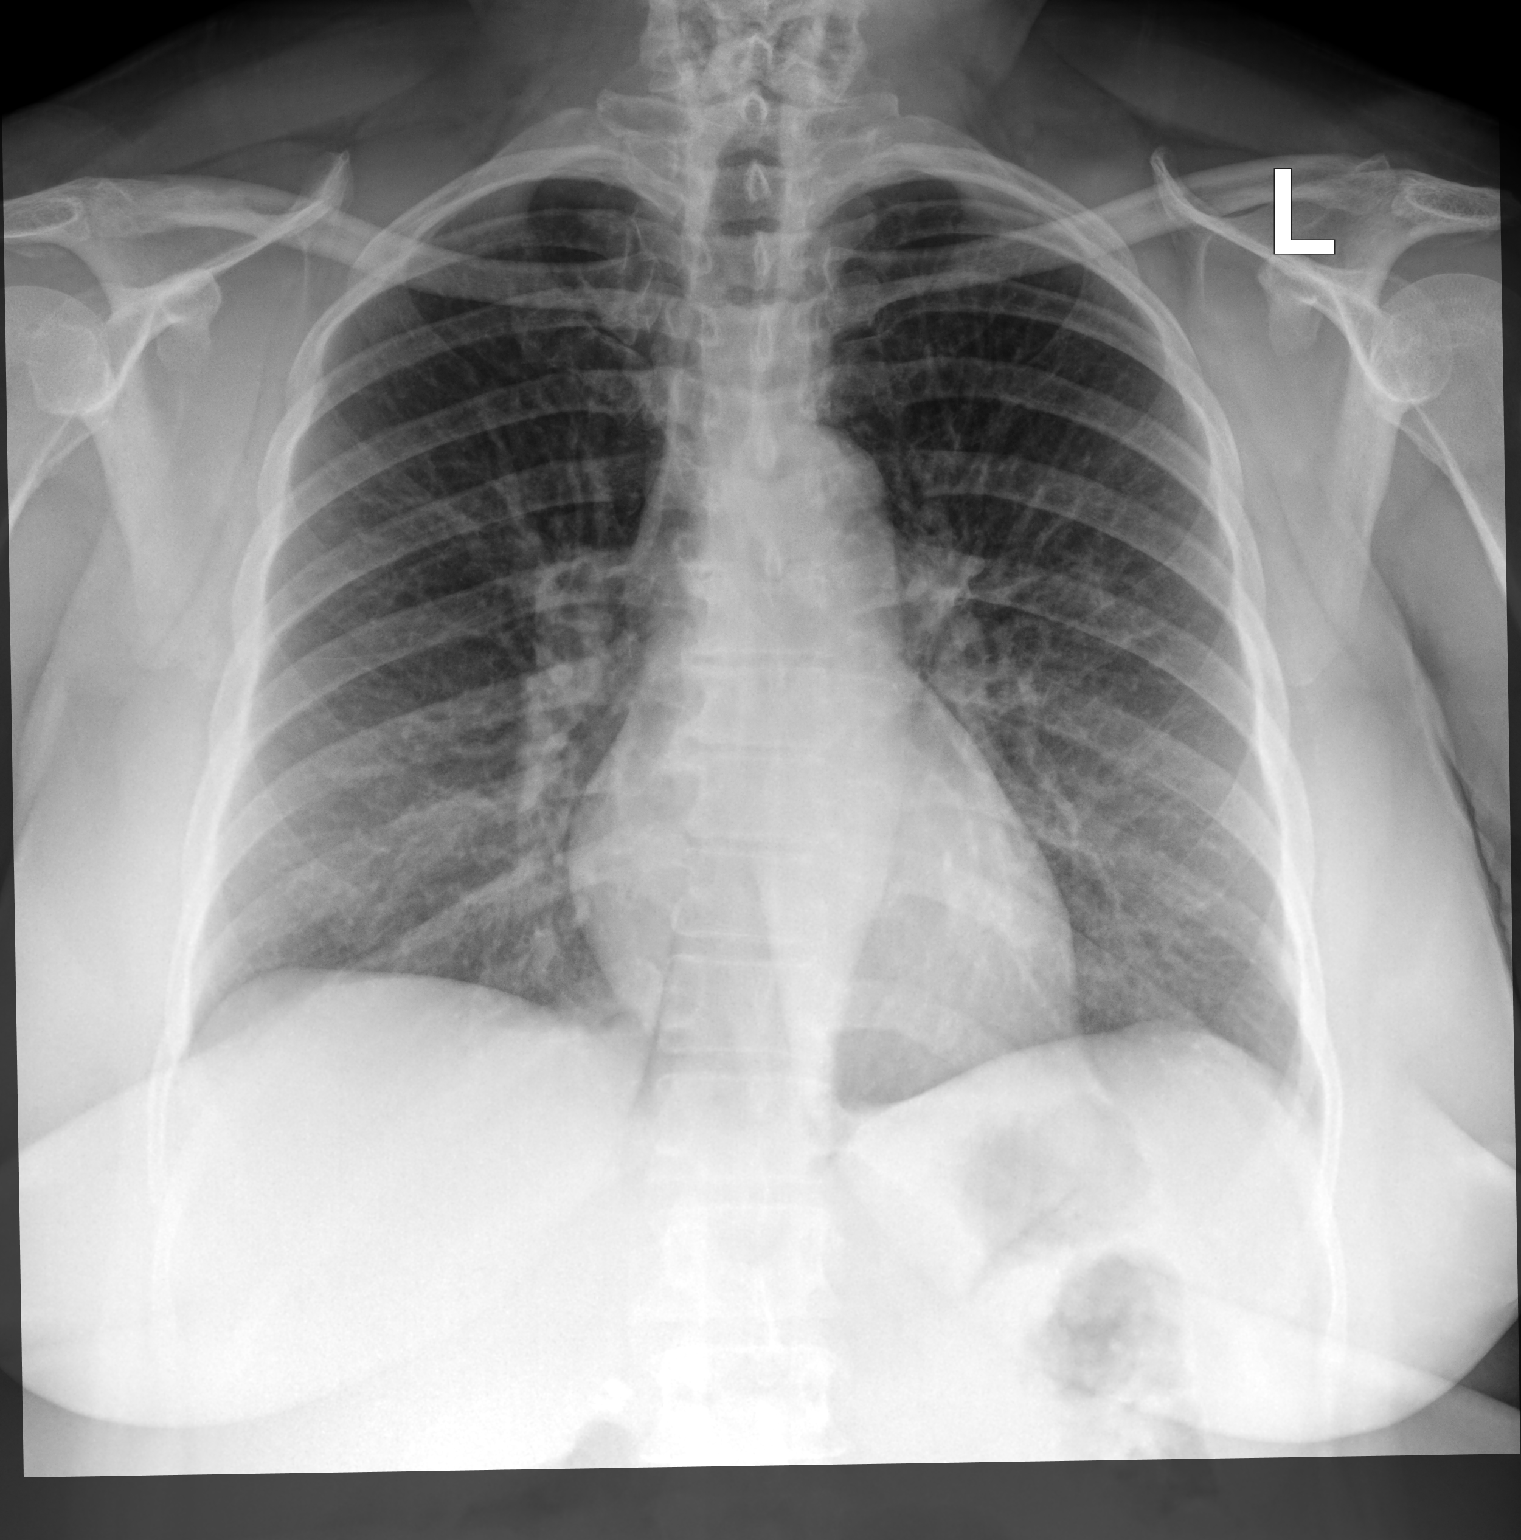

[chest lat]
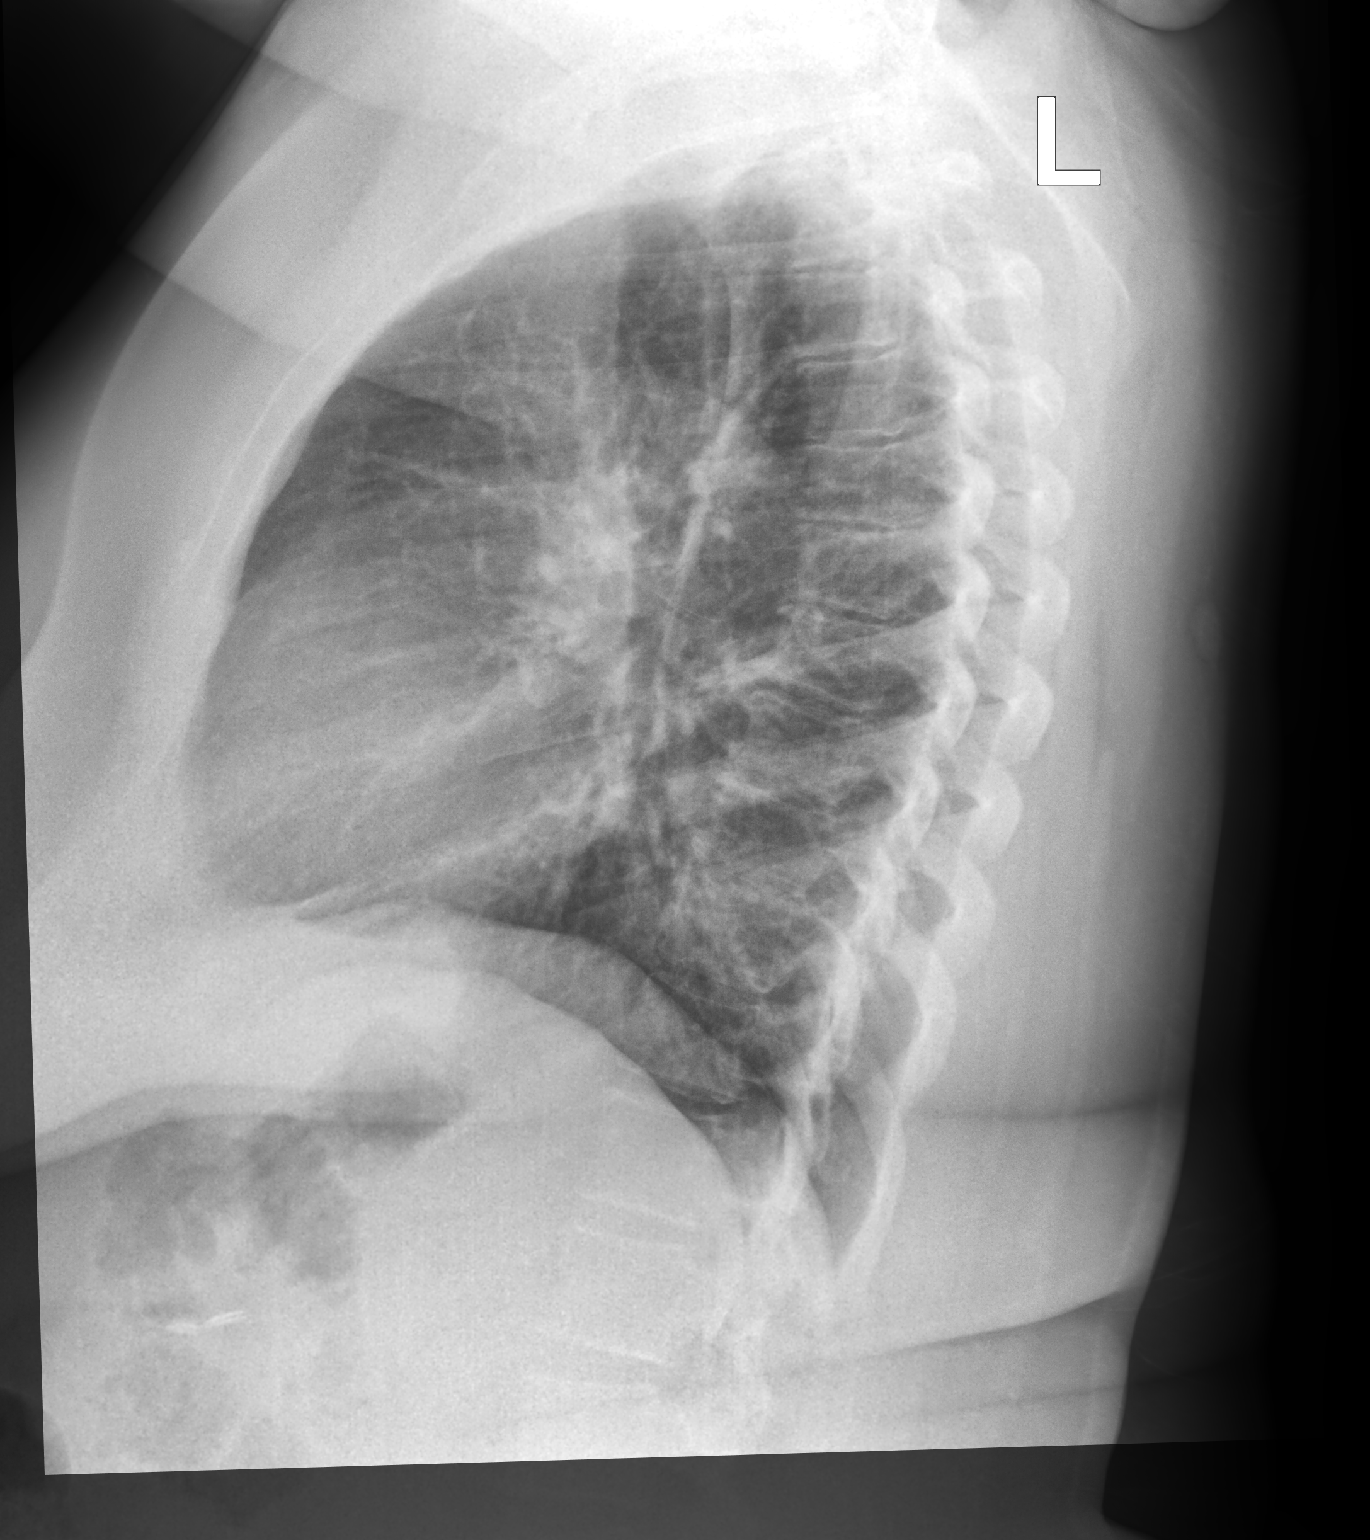

[2 of 2 positions shown; findings below may reference images not displayed]

FINDINGS: Lungs clear. Heart size normal. No pneumothorax or pleural fluid. No
acute or focal bony abnormality.
IMPRESSION: Negative chest.
